# Patient Record
Sex: Male | Born: 2000 | Race: White | Hispanic: No | Marital: Married | Smoking: Never smoker
Health system: Southern US, Community
[De-identification: ages and names within clinical notes are randomized; demographics above are authoritative.]

## PROBLEM LIST (undated history)

## (undated) DIAGNOSIS — J45909 Unspecified asthma, uncomplicated: Secondary | ICD-10-CM

## (undated) DIAGNOSIS — T7840XA Allergy, unspecified, initial encounter: Secondary | ICD-10-CM

## (undated) HISTORY — DX: Unspecified asthma, uncomplicated: J45.909

## (undated) HISTORY — DX: Allergy, unspecified, initial encounter: T78.40XA

---
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
## 2022-12-08 ENCOUNTER — Encounter: Payer: Self-pay | Admitting: Family Medicine

## 2022-12-08 ENCOUNTER — Telehealth: Payer: Self-pay | Admitting: Pediatrics

## 2022-12-08 ENCOUNTER — Ambulatory Visit (INDEPENDENT_AMBULATORY_CARE_PROVIDER_SITE_OTHER): Payer: Medicaid Other | Admitting: Family Medicine

## 2022-12-08 ENCOUNTER — Telehealth: Payer: Self-pay | Admitting: Family Medicine

## 2022-12-08 VITALS — BP 132/84 | HR 84 | Temp 97.7°F | Ht 73.0 in | Wt 301.0 lb

## 2022-12-08 DIAGNOSIS — L2082 Flexural eczema: Secondary | ICD-10-CM | POA: Insufficient documentation

## 2022-12-08 DIAGNOSIS — J4540 Moderate persistent asthma, uncomplicated: Secondary | ICD-10-CM | POA: Insufficient documentation

## 2022-12-08 DIAGNOSIS — F418 Other specified anxiety disorders: Secondary | ICD-10-CM | POA: Insufficient documentation

## 2022-12-08 MED ORDER — VENLAFAXINE HCL ER 37.5 MG PO CP24: ORAL_CAPSULE | ORAL | 0 refills | Status: DC

## 2022-12-08 MED ORDER — TRIAMCINOLONE ACETONIDE 0.1 % EX OINT
TOPICAL_OINTMENT | CUTANEOUS | 0 refills | Status: AC
Start: 2022-12-08 — End: ?

## 2022-12-08 MED ORDER — BUDESONIDE-FORMOTEROL FUMARATE 80-4.5 MCG/ACT IN AERO
2.0000 | INHALATION_SPRAY | Freq: Two times a day (BID) | RESPIRATORY_TRACT | 3 refills | Status: DC
Start: 2022-12-08 — End: 2023-06-27

## 2022-12-08 NOTE — Telephone Encounter (Signed)
Spoke with pharmacist regarding prescriptions patient was able to pick up two but insurance will not cover for patient to take twice a day. Pharmacy states that rx for 37.5 for 7days can be written and additional Rx 75mg  written to be take daily for insurance to cover. Please advise.

## 2022-12-08 NOTE — Progress Notes (Signed)
New Patient Office Visit  Subjective    Patient ID: Justin Hoffman, male    DOB: Nov 05, 2000  Age: 22 y.o. MRN: [id number]  CC:  Chief Complaint  Patient presents with   Establish Care    NP/establish care concerns about eczema and asthma flare up. Possible ADHD. Patient not fasting.     HPI Justin Hoffman presents to establish care. Encounter Diagnoses  Name Primary?   Depression with anxiety Yes   Moderate persistent asthma without complication    Flexural eczema    For establishment of care with a discussion of all health problems.  Longstanding history of eczema and asthma.  Eczema affects his pends flexural areas and neck.  Ongoing history of asthma.  He has been using Ventolin to treat.  He uses Ventolin 1-2 times daily.  Things are worse in the spring and fall the weather changes from warm to cold.  Tells of easy distractibility and difficulty with focus.  He is sad and worries with current world issues.  He is worried about the state of the world for his 8 and 84-year-old children.  It is difficult for him to stay in the moment.  He did well in school and graduated from high school.  He lives with his wife and 2 children.  He does not have regular dental care.  He works in [employer] and works out in [employer] several days weekly.  He got his first job at age 21.  He left home at age 68 and has been on his own ever since.  Outpatient Encounter Medications as of 12/08/2022  Medication Sig   budesonide-formoterol (SYMBICORT) 80-4.5 MCG/ACT inhaler Inhale 2 puffs into the lungs 2 (two) times daily. Rinse mouth with water after second inhalation.   triamcinolone ointment (KENALOG) 0.1 % Apply a thin coat to eczema rash twice daily.  Not to be used on private areas or face.   venlafaxine XR (EFFEXOR XR) 37.5 MG 24 hr capsule Take 1 capsule (37.5 mg total) by mouth daily with breakfast for 7 days, THEN 2 capsules (75 mg total) daily with breakfast.   VENTOLIN HFA 108 (90  Base) MCG/ACT inhaler Inhale 2 puffs into the lungs every 6 (six) hours as needed.   No facility-administered encounter medications on file as of 12/08/2022.    Past Medical History:  Diagnosis Date   Allergy    Asthma     History reviewed. No pertinent surgical history.  Family History  Problem Relation Age of Onset   Arthritis Mother    Heart disease Father     Social History   Socioeconomic History   Marital status: Married    Spouse name: Not on file   Number of children: Not on file   Years of education: Not on file   Highest education level: Not on file  Occupational History   Not on file  Tobacco Use   Smoking status: Never   Smokeless tobacco: Never  Vaping Use   Vaping Use: Every day  Substance and Sexual Activity   Alcohol use: Yes    Alcohol/week: 1.0 standard drink of alcohol    Types: 1 Cans of beer per week    Comment: social   Drug use: Never   Sexual activity: Yes  Other Topics Concern   Not on file  Social History Narrative   Not on file   Social Determinants of Health   Financial Resource Strain: Not on file  Food Insecurity: Not on  file  Transportation Needs: Not on file  Physical Activity: Not on file  Stress: Not on file  Social Connections: Not on file  Intimate Partner Violence: Not on file    Review of Systems  Constitutional: Negative.   HENT: Negative.    Eyes:  Negative for blurred vision, discharge and redness.  Respiratory:  Positive for wheezing.   Cardiovascular: Negative.   Gastrointestinal:  Negative for abdominal pain.  Genitourinary: Negative.   Musculoskeletal: Negative.  Negative for myalgias.  Skin:  Positive for itching and rash.  Neurological:  Negative for tingling, loss of consciousness and weakness.  Endo/Heme/Allergies:  Negative for polydipsia.          12/08/2022   10:38 AM 12/08/2022   10:33 AM  Depression screen PHQ 2/9  Decreased Interest 1 0  Down, Depressed, Hopeless 1 0  PHQ - 2 Score 2 0   Altered sleeping 1   Tired, decreased energy 2   Change in appetite 0   Feeling bad or failure about yourself  1   Trouble concentrating 2   Moving slowly or fidgety/restless 3   Suicidal thoughts 0   PHQ-9 Score 11   Difficult doing work/chores Somewhat difficult      Objective    BP 132/84 (BP Location: Right Arm, Patient Position: Sitting, Cuff Size: Large)   Pulse 84   Temp 97.7 F (36.5 C) (Temporal)   Ht 6\' 1"  (1.854 m)   Wt (!) 301 lb (136.5 kg)   SpO2 95%   BMI 39.71 kg/m   Physical Exam Constitutional:      General: He is not in acute distress.    Appearance: Normal appearance. He is not ill-appearing, toxic-appearing or diaphoretic.  HENT:     Head: Normocephalic and atraumatic.     Right Ear: External ear normal.     Left Ear: External ear normal.  Eyes:     General: No scleral icterus.       Right eye: No discharge.        Left eye: No discharge.     Extraocular Movements: Extraocular movements intact.     Conjunctiva/sclera: Conjunctivae normal.  Cardiovascular:     Rate and Rhythm: Normal rate and regular rhythm.  Pulmonary:     Effort: Pulmonary effort is normal. No respiratory distress.     Breath sounds: Normal breath sounds. No wheezing.  Skin:    General: Skin is warm and dry.     Comments: There is scaling overlying erythema of the left and right third and fourth fingers.  Fine scaling of the antecubital fossa.  Neurological:     Mental Status: He is alert and oriented to person, place, and time.  Psychiatric:        Mood and Affect: Mood normal.        Behavior: Behavior normal.         Assessment & Plan:   Depression with anxiety -     Venlafaxine HCl ER; Take 1 capsule (37.5 mg total) by mouth daily with breakfast for 7 days, THEN 2 capsules (75 mg total) daily with breakfast.  Dispense: 67 capsule; Refill: 0  Moderate persistent asthma without complication -     Budesonide-Formoterol Fumarate; Inhale 2 puffs into the lungs 2  (two) times daily. Rinse mouth with water after second inhalation.  Dispense: 1 each; Refill: 3  Flexural eczema -     Triamcinolone Acetonide; Apply a thin coat to eczema rash twice daily.  Not to  be used on private areas or face.  Dispense: 80 g; Refill: 0     Return in about 5 weeks (around 01/12/2023).  Spent over 40 minutes taking patient's history and discussing treatment options.  Information was given on the atopic dermatitis, asthma and venlafaxine.  Mliss Sax, MD

## 2022-12-08 NOTE — Telephone Encounter (Signed)
Pts birthday was incorrect it is [birth year] not [birth year]. He went to the pharmacy to pick up Venlafaxine and was told he could not. Please follow through for him. It could be because of the date.  [facility] [place]

## 2022-12-09 MED ORDER — VENLAFAXINE HCL ER 37.5 MG PO CP24
ORAL_CAPSULE | ORAL | 0 refills | Status: DC
Start: 2022-12-09 — End: 2022-12-09

## 2022-12-09 MED ORDER — VENLAFAXINE HCL ER 37.5 MG PO CP24
37.5000 mg | ORAL_CAPSULE | Freq: Every day | ORAL | 0 refills | Status: DC
Start: 2022-12-09 — End: 2023-01-05

## 2022-12-09 MED ORDER — VENLAFAXINE HCL ER 75 MG PO CP24
75.0000 mg | ORAL_CAPSULE | Freq: Every day | ORAL | 0 refills | Status: DC
Start: 2022-12-09 — End: 2023-01-05

## 2022-12-09 NOTE — Telephone Encounter (Signed)
error 

## 2022-12-09 NOTE — Addendum Note (Signed)
Addended by: Andrez Grime on: 12/09/2022 09:26 AM   Modules accepted: Orders

## 2022-12-24 ENCOUNTER — Ambulatory Visit: Payer: Medicaid Other | Admitting: Family Medicine

## 2023-01-05 ENCOUNTER — Other Ambulatory Visit: Payer: Self-pay | Admitting: Family Medicine

## 2023-01-05 DIAGNOSIS — F418 Other specified anxiety disorders: Secondary | ICD-10-CM

## 2023-01-12 ENCOUNTER — Ambulatory Visit (INDEPENDENT_AMBULATORY_CARE_PROVIDER_SITE_OTHER): Payer: Medicaid Other | Admitting: Family Medicine

## 2023-01-12 ENCOUNTER — Encounter: Payer: Self-pay | Admitting: Family Medicine

## 2023-01-12 VITALS — BP 128/82 | HR 85 | Temp 98.5°F | Ht 73.0 in | Wt 292.6 lb

## 2023-01-12 DIAGNOSIS — F418 Other specified anxiety disorders: Secondary | ICD-10-CM | POA: Diagnosis not present

## 2023-01-12 MED ORDER — VENLAFAXINE HCL ER 150 MG PO CP24: 150.0000 mg | ORAL_CAPSULE | Freq: Every day | ORAL | 0 refills | Status: AC

## 2023-01-12 NOTE — Progress Notes (Signed)
Established Patient Office Visit   Subjective:  Patient ID: Justin Hoffman, male    DOB: [birth year]  Age: 22 y.o. MRN: [id number]  Chief Complaint  Patient presents with   Medical Management of Chronic Issues    1 month follow up, patient feels like he may need to go up on Effexor does not seem to be helping as much as it did when he first started.       HPI Encounter Diagnoses  Name Primary?   Depression with anxiety Yes   Improving gradually.  Continues to feel pangs of anxiety and depression.  Things seem to be better at home.  He is more involved with the kids.  His wife seems to be pleased.  Work performance seems to be improved.  Did not see much of a difference going from 37.5-75 but overall is improving.   Review of Systems  Constitutional: Negative.   HENT: Negative.    Eyes:  Negative for blurred vision, discharge and redness.  Respiratory: Negative.    Cardiovascular: Negative.   Gastrointestinal:  Negative for abdominal pain.  Genitourinary: Negative.   Musculoskeletal: Negative.  Negative for myalgias.  Skin:  Negative for rash.  Neurological:  Negative for tingling, loss of consciousness and weakness.  Endo/Heme/Allergies:  Negative for polydipsia.     Current Outpatient Medications:    albuterol (PROVENTIL HFA;VENTOLIN HFA) 108 (90 BASE) MCG/ACT inhaler, Inhale 2 puffs into the lungs every 6 (six) hours as needed. For shortness of breath, Disp: , Rfl:    budesonide-formoterol (SYMBICORT) 80-4.5 MCG/ACT inhaler, Inhale 2 puffs into the lungs 2 (two) times daily. Rinse mouth with water after second inhalation., Disp: 1 each, Rfl: 3   triamcinolone ointment (KENALOG) 0.1 %, Apply a thin coat to eczema rash twice daily.  Not to be used on private areas or face., Disp: 80 g, Rfl: 0   venlafaxine XR (EFFEXOR XR) 150 MG 24 hr capsule, Take 1 capsule (150 mg total) by mouth daily with breakfast., Disp: 90 capsule, Rfl: 0   VENTOLIN HFA 108 (90 Base) MCG/ACT  inhaler, Inhale 2 puffs into the lungs every 6 (six) hours as needed., Disp: , Rfl:    Objective:     BP 128/82 (BP Location: Right Arm, Patient Position: Sitting, Cuff Size: Large)   Pulse 85   Temp 98.5 F (36.9 C) (Temporal)   Ht 6\' 1"  (1.854 m)   Wt 292 lb 9.6 oz (132.7 kg)   SpO2 97%   BMI 38.60 kg/m    Physical Exam Constitutional:      General: He is not in acute distress.    Appearance: Normal appearance. He is not ill-appearing, toxic-appearing or diaphoretic.  HENT:     Head: Normocephalic and atraumatic.     Right Ear: External ear normal.     Left Ear: External ear normal.  Eyes:     General: No scleral icterus.       Right eye: No discharge.        Left eye: No discharge.     Extraocular Movements: Extraocular movements intact.     Conjunctiva/sclera: Conjunctivae normal.  Pulmonary:     Effort: Pulmonary effort is normal. No respiratory distress.  Skin:    General: Skin is warm and dry.  Neurological:     Mental Status: He is alert and oriented to person, place, and time.  Psychiatric:        Mood and Affect: Mood normal.  Behavior: Behavior normal.      No results found for any visits on 01/12/23.    The ASCVD Risk score (Arnett DK, et al., 2019) failed to calculate for the following reasons:   The 2019 ASCVD risk score is only valid for ages 30 to 66    Assessment & Plan:   Depression with anxiety -     Venlafaxine HCl ER; Take 1 capsule (150 mg total) by mouth daily with breakfast.  Dispense: 90 capsule; Refill: 0    Return in about 8 weeks (around 03/09/2023), or if symptoms worsen or fail to improve.  Increasing Effexor to 150 mg daily.  Advised that he should expect gradual improvement in symptoms over the next 4 weeks.  Follow-up in 2 months or sooner if needed.  Mliss Sax, MD

## 2023-03-09 ENCOUNTER — Telehealth: Payer: Self-pay | Admitting: Family Medicine

## 2023-03-09 ENCOUNTER — Ambulatory Visit: Payer: Medicaid Other | Admitting: Family Medicine

## 2023-03-09 NOTE — Telephone Encounter (Signed)
8.7.24 no show letter sent

## 2023-03-17 NOTE — Telephone Encounter (Signed)
Pt responded via text "text no" and is not counted as no show

## 2023-03-31 ENCOUNTER — Telehealth: Payer: Medicaid Other | Admitting: Family Medicine

## 2023-03-31 DIAGNOSIS — J019 Acute sinusitis, unspecified: Secondary | ICD-10-CM | POA: Diagnosis not present

## 2023-03-31 DIAGNOSIS — B9689 Other specified bacterial agents as the cause of diseases classified elsewhere: Secondary | ICD-10-CM

## 2023-04-01 MED ORDER — AMOXICILLIN-POT CLAVULANATE 875-125 MG PO TABS: 1.0000 | ORAL_TABLET | Freq: Two times a day (BID) | ORAL | 0 refills | Status: AC

## 2023-04-01 NOTE — Progress Notes (Signed)
E-Visit for Sinus Problems  We are sorry that you are not feeling well.  Here is how we plan to help!  Based on what you have shared with me it looks like you have sinusitis.  Sinusitis is inflammation and infection in the sinus cavities of the head.  Based on your presentation I believe you most likely have Acute Bacterial Sinusitis.  This is an infection caused by bacteria and is treated with antibiotics. I have prescribed Augmentin 875mg/125mg one tablet twice daily with food, for 7 days. You may use an oral decongestant such as Mucinex D or if you have glaucoma or high blood pressure use plain Mucinex. Saline nasal spray help and can safely be used as often as needed for congestion.  If you develop worsening sinus pain, fever or notice severe headache and vision changes, or if symptoms are not better after completion of antibiotic, please schedule an appointment with a health care provider.    Sinus infections are not as easily transmitted as other respiratory infection, however we still recommend that you avoid close contact with loved ones, especially the very young and elderly.  Remember to wash your hands thoroughly throughout the day as this is the number one way to prevent the spread of infection!  Home Care: Only take medications as instructed by your medical team. Complete the entire course of an antibiotic. Do not take these medications with alcohol. A steam or ultrasonic humidifier can help congestion.  You can place a towel over your head and breathe in the steam from hot water coming from a faucet. Avoid close contacts especially the very young and the elderly. Cover your mouth when you cough or sneeze. Always remember to wash your hands.  Get Help Right Away If: You develop worsening fever or sinus pain. You develop a severe head ache or visual changes. Your symptoms persist after you have completed your treatment plan.  Make sure you Understand these instructions. Will watch  your condition. Will get help right away if you are not doing well or get worse.  Thank you for choosing an e-visit.  Your e-visit answers were reviewed by a board certified advanced clinical practitioner to complete your personal care plan. Depending upon the condition, your plan could have included both over the counter or prescription medications.  Please review your pharmacy choice. Make sure the pharmacy is open so you can pick up prescription now. If there is a problem, you may contact your provider through MyChart messaging and have the prescription routed to another pharmacy.  Your safety is important to us. If you have drug allergies check your prescription carefully.   For the next 24 hours you can use MyChart to ask questions about today's visit, request a non-urgent call back, or ask for a work or school excuse. You will get an email in the next two days asking about your experience. I hope that your e-visit has been valuable and will speed your recovery.    have provided 5 minutes of non face to face time during this encounter for chart review and documentation.   

## 2023-06-14 ENCOUNTER — Other Ambulatory Visit: Payer: Self-pay | Admitting: Family Medicine

## 2023-06-14 DIAGNOSIS — F418 Other specified anxiety disorders: Secondary | ICD-10-CM

## 2023-06-27 ENCOUNTER — Other Ambulatory Visit: Payer: Self-pay | Admitting: Family Medicine

## 2023-06-27 DIAGNOSIS — J4540 Moderate persistent asthma, uncomplicated: Secondary | ICD-10-CM

## 2023-07-14 ENCOUNTER — Other Ambulatory Visit: Payer: Self-pay | Admitting: Family Medicine

## 2023-07-14 DIAGNOSIS — J4540 Moderate persistent asthma, uncomplicated: Secondary | ICD-10-CM

## 2023-07-20 MED ORDER — BUDESONIDE-FORMOTEROL FUMARATE 80-4.5 MCG/ACT IN AERO: 2.0000 | INHALATION_SPRAY | Freq: Two times a day (BID) | RESPIRATORY_TRACT | 3 refills | Status: AC

## 2023-07-20 NOTE — Addendum Note (Signed)
Addended by: Waymond Cera on: 07/20/2023 04:06 PM   Modules accepted: Orders

## 2023-08-17 ENCOUNTER — Other Ambulatory Visit (HOSPITAL_COMMUNITY): Payer: Self-pay

## 2023-10-23 ENCOUNTER — Other Ambulatory Visit: Payer: Self-pay

## 2023-10-23 ENCOUNTER — Emergency Department

## 2023-10-23 ENCOUNTER — Emergency Department
Admission: EM | Admit: 2023-10-23 | Discharge: 2023-10-23 | Disposition: A | Discharge status: Home / Self Care | Attending: Emergency Medicine | Admitting: Emergency Medicine

## 2023-10-23 DIAGNOSIS — S63502A Unspecified sprain of left wrist, initial encounter: Secondary | ICD-10-CM | POA: Diagnosis not present

## 2023-10-23 DIAGNOSIS — Y9351 Activity, roller skating (inline) and skateboarding: Secondary | ICD-10-CM | POA: Diagnosis not present

## 2023-10-23 DIAGNOSIS — M25532 Pain in left wrist: Secondary | ICD-10-CM | POA: Diagnosis present

## 2023-10-23 MED ORDER — ACETAMINOPHEN 325 MG PO TABS
650.0000 mg | ORAL_TABLET | Freq: Once | ORAL | Status: AC
Start: 2023-10-23 — End: 2023-10-23
  Administered 2023-10-23: 650 mg via ORAL
  Filled 2023-10-23: qty 2

## 2023-10-23 MED ORDER — OXYCODONE-ACETAMINOPHEN 5-325 MG PO TABS
1.0000 | ORAL_TABLET | Freq: Once | ORAL | Status: AC
  Administered 2023-10-23: 1 via ORAL
  Filled 2023-10-23: qty 1

## 2023-10-23 NOTE — Discharge Instructions (Addendum)
 Please use the provided wrist splint as needed for comfort.  As we discussed, we recommend that you take ibuprofen 600 mg 3 times a day with meals for the next 5 days.  You may also take Tylenol 1000 mg 4 times a day.  The two of these medications should make your pain tolerable.  You can also continue using ice packs as needed for comfort.  Please read through the included information and follow-up with the medical provider(s) listed.

## 2023-10-23 NOTE — ED Provider Notes (Signed)
 [facility] Provider Note    Event Date/Time   First MD Initiated Contact with Patient 10/23/23 [phone]     (approximate)   History   Wrist Pain   HPI Justin Hoffman is a 23 y.o. male who presents for evaluation of pain in his left wrist and hand.  He fell off of his skateboard and landed on his left outstretched wrist.  He is right-hand dominant.  He has pain whenever he tries to flex his wrist or move his hand.  No numbness or tingling, no significant swelling.     Physical Exam   Triage Vital Signs: ED Triage Vitals  Encounter Vitals Group     BP 10/23/23 0126 (!) 146/95     Systolic BP Percentile --      Diastolic BP Percentile --      Pulse Rate 10/23/23 0126 73     Resp 10/23/23 0126 16     Temp 10/23/23 0126 98 F (36.7 C)     Temp Source 10/23/23 0126 Oral     SpO2 10/23/23 0126 98 %     Weight 10/23/23 0127 133.8 kg (295 lb)     Height 10/23/23 0127 1.854 m (6\' 1" )     Head Circumference --      Peak Flow --      Pain Score 10/23/23 0127 10     Pain Loc --      Pain Education --      Exclude from Growth Chart --     Most recent vital signs: Vitals:   10/23/23 0126  BP: (!) 146/95  Pulse: 73  Resp: 16  Temp: 98 F (36.7 C)  SpO2: 98%    General: Awake, no obvious distress. CV:  Good peripheral perfusion.  Easily palpable left radial pulse. Resp:  Normal effort. Speaking easily and comfortably, no accessory muscle usage nor intercostal retractions.   Abd:  No distention.  Other:  Patient is favoring his left wrist and hand but there is no appreciable swelling or bruising.  He has tenderness to palpation and manipulation of the left wrist including with flexion extension of the joint and he has tenderness throughout palpation of the metacarpals but without any specific point tenderness.  No specific snuffbox tenderness.  Patient is reluctant to flex his fingers but can do so with encouragement.   ED Results / Procedures  / Treatments   Labs (all labs ordered are listed, but only abnormal results are displayed) Labs Reviewed - No data to display   RADIOLOGY I viewed and interpreted the patient's wrist x-rays and I see no evidence of fracture nor dislocation.  I also read the radiologist's report, which confirmed no acute findings.   PROCEDURES:  Critical Care performed: No  Procedures    IMPRESSION / MDM / ASSESSMENT AND PLAN / ED COURSE  I reviewed the triage vital signs and the nursing notes.                              Differential diagnosis includes, but is not limited to, fracture, dislocation, contusion, sprain.  Patient's presentation is most consistent with acute complicated illness / injury requiring diagnostic workup.  Labs/studies ordered: Wrist x-rays  Interventions/Medications given:  Medications  oxyCODONE-acetaminophen (PERCOCET/ROXICET) 5-325 MG per tablet 1 tablet (has no administration in time range)  acetaminophen (TYLENOL) tablet 650 mg (has no administration in time range)    (Note:  hospital course my include additional interventions and/or labs/studies not listed above.)   Patient has a reassuring physical exam and imaging that shows no evidence of fracture or dislocation.  Likely sprain and contusion.  Appropriate for Velcro wrist splint with ABD thumb to help provide additional immobilization.  Recommend outpatient follow-up as needed with orthopedics and I gave my usual customary wrist sprain recommendations and return precautions.  Patient understands and agrees with the plan.  1 Percocet and 650 of Tylenol prior to discharge, and the patient will use ice packs and OTC analgesia at home.       FINAL CLINICAL IMPRESSION(S) / ED DIAGNOSES   Final diagnoses:  Sprain of left wrist, initial encounter     Rx / DC Orders   ED Discharge Orders     None        Note:  This document was prepared using Dragon voice recognition software and may include  unintentional dictation errors.   Loleta Rose, MD 10/23/23 [phone]

## 2023-10-23 NOTE — ED Triage Notes (Signed)
 Pt to ED by pov from home with c/o L wrist pain from falling off his skateboard and landing on his wrist. PSM intact, rates pain 10/10. VSS, NADN.

## 2023-10-25 ENCOUNTER — Telehealth: Payer: Self-pay

## 2023-10-25 NOTE — Transitions of Care (Post Inpatient/ED Visit) (Signed)
   10/25/2023  Name: Justin Hoffman MRN: [id number] DOB: Dec 25, 2000  Today's TOC FU Call Status: Today's TOC FU Call Status:: Successful TOC FU Call Completed TOC FU Call Complete Date: 10/25/23 Patient's Name and Date of Birth confirmed.  Transition Care Management Follow-up Telephone Call Date of Discharge: 10/23/23 Discharge Facility: [facility]) Type of Discharge: Emergency Department Reason for ED Visit: Other: (Sprain of left wrist) How have you been since you were released from the hospital?: Same Any questions or concerns?: Yes Patient Questions/Concerns:: left wrist pain and swelling present. Pt is currently using a splint. Patient Questions/Concerns Addressed: Provided Patient Educational Materials  Items Reviewed: Did you receive and understand the discharge instructions provided?: Yes Medications obtained,verified, and reconciled?: Yes (Medications Reviewed) Any new allergies since your discharge?: No Dietary orders reviewed?: NA Do you have support at home?: Yes People in Home: spouse Name of Support/Comfort Primary Source: Wife  Medications Reviewed Today: Medications Reviewed Today     Reviewed by Leroy Kennedy, CMA (Certified Medical Assistant) on 10/25/23 at 1002  Med List Status: <None>   Medication Order Taking? Sig Documenting Provider Last Dose Status Informant  albuterol (PROVENTIL HFA;VENTOLIN HFA) 108 (90 BASE) MCG/ACT inhaler [id number] Yes Inhale 2 puffs into the lungs every 6 (six) hours as needed. For shortness of breath [provider] Taking Active Father  amoxicillin-clavulanate (AUGMENTIN) 875-125 MG tablet [id number] Yes Take 1 tablet by mouth 2 (two) times daily. Delorse Lek, FNP Taking Active   budesonide-formoterol [facility]) 80-4.5 MCG/ACT inhaler [id number] Yes Inhale 2 puffs into the lungs in the morning and at bedtime. Mliss Sax, MD Taking Active   triamcinolone ointment (KENALOG) 0.1 %  [id number] Yes Apply a thin coat to eczema rash twice daily.  Not to be used on private areas or face. Mliss Sax, MD Taking Active   venlafaxine XR (EFFEXOR XR) 150 MG 24 hr capsule [id number] Yes Take 1 capsule (150 mg total) by mouth daily with breakfast. Mliss Sax, MD Taking Active   VENTOLIN HFA 108 [phone] Base) MCG/ACT inhaler [id number] Yes Inhale 2 puffs into the lungs every 6 (six) hours as needed. [provider] Taking Active             Home Care and Equipment/Supplies: Were Home Health Services Ordered?: NA Any new equipment or medical supplies ordered?: Yes (splint) Name of Medical supply agency?: Unsure Were you able to get the equipment/medical supplies?: Yes Do you have any questions related to the use of the equipment/supplies?: No  Functional Questionnaire: Do you need assistance with bathing/showering or dressing?: No Do you need assistance with meal preparation?: Yes Do you need assistance with eating?: No Do you have difficulty maintaining continence: No Do you need assistance with getting out of bed/getting out of a chair/moving?: No Do you have difficulty managing or taking your medications?: No  Follow up appointments reviewed: PCP Follow-up appointment confirmed?: Yes Date of PCP follow-up appointment?: 11/08/23 Follow-up Provider: Dr. Doreene Burke [facility] Follow-up appointment confirmed?: NA Do you need transportation to your follow-up appointment?: No Do you understand care options if your condition(s) worsen?: Yes-patient verbalized understanding    SIGNATURE Jodelle Green ,RMA

## 2023-11-08 ENCOUNTER — Ambulatory Visit: Admitting: Family Medicine

## 2024-02-17 ENCOUNTER — Encounter: Payer: Self-pay | Admitting: Advanced Practice Midwife

## 2024-03-01 ENCOUNTER — Other Ambulatory Visit: Payer: Self-pay | Admitting: Family Medicine

## 2024-03-01 DIAGNOSIS — J4540 Moderate persistent asthma, uncomplicated: Secondary | ICD-10-CM

## 2024-04-08 ENCOUNTER — Other Ambulatory Visit: Payer: Self-pay | Admitting: Family Medicine

## 2024-04-08 DIAGNOSIS — L2082 Flexural eczema: Secondary | ICD-10-CM
